# Patient Record
Sex: Male | Born: 1985 | Race: White | Hispanic: No | Marital: Single | Smoking: Current every day smoker
Health system: Southern US, Community
[De-identification: ages and names within clinical notes are randomized; demographics above are authoritative.]

## PROBLEM LIST (undated history)

## (undated) DIAGNOSIS — F1111 Opioid abuse, in remission: Secondary | ICD-10-CM

## (undated) HISTORY — PX: APPENDECTOMY: SHX54

---
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
## 2007-11-15 IMAGING — CR DG FOOT COMPLETE 3+V*R*
3 series · 3 of 3 positions shown · non-contrast
Comparison: none

CLINICAL DATA: Right foot crushing injury.  Heel pain.  
 RIGHT FOOT - 3 VIEW:
 There is no evidence of fracture or dislocation.  There is no evidence of arthropathy or other focal bone abnormality.  Soft tissues are unremarkable.

[t foot ap right]
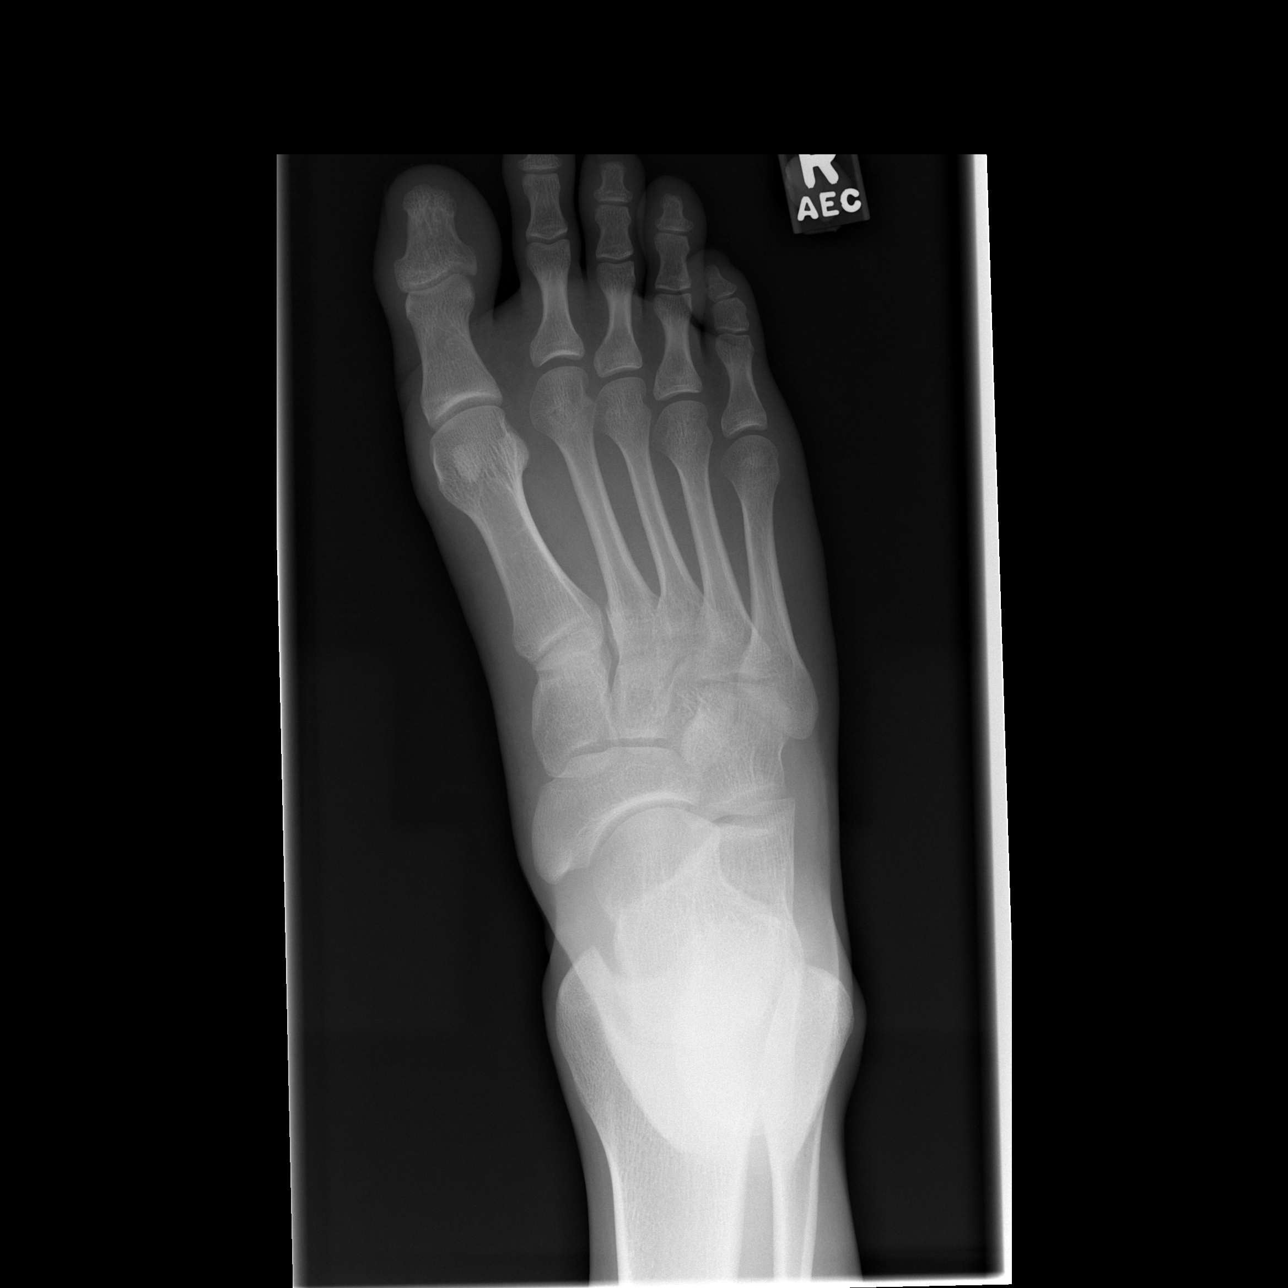

[t foot oblique right]
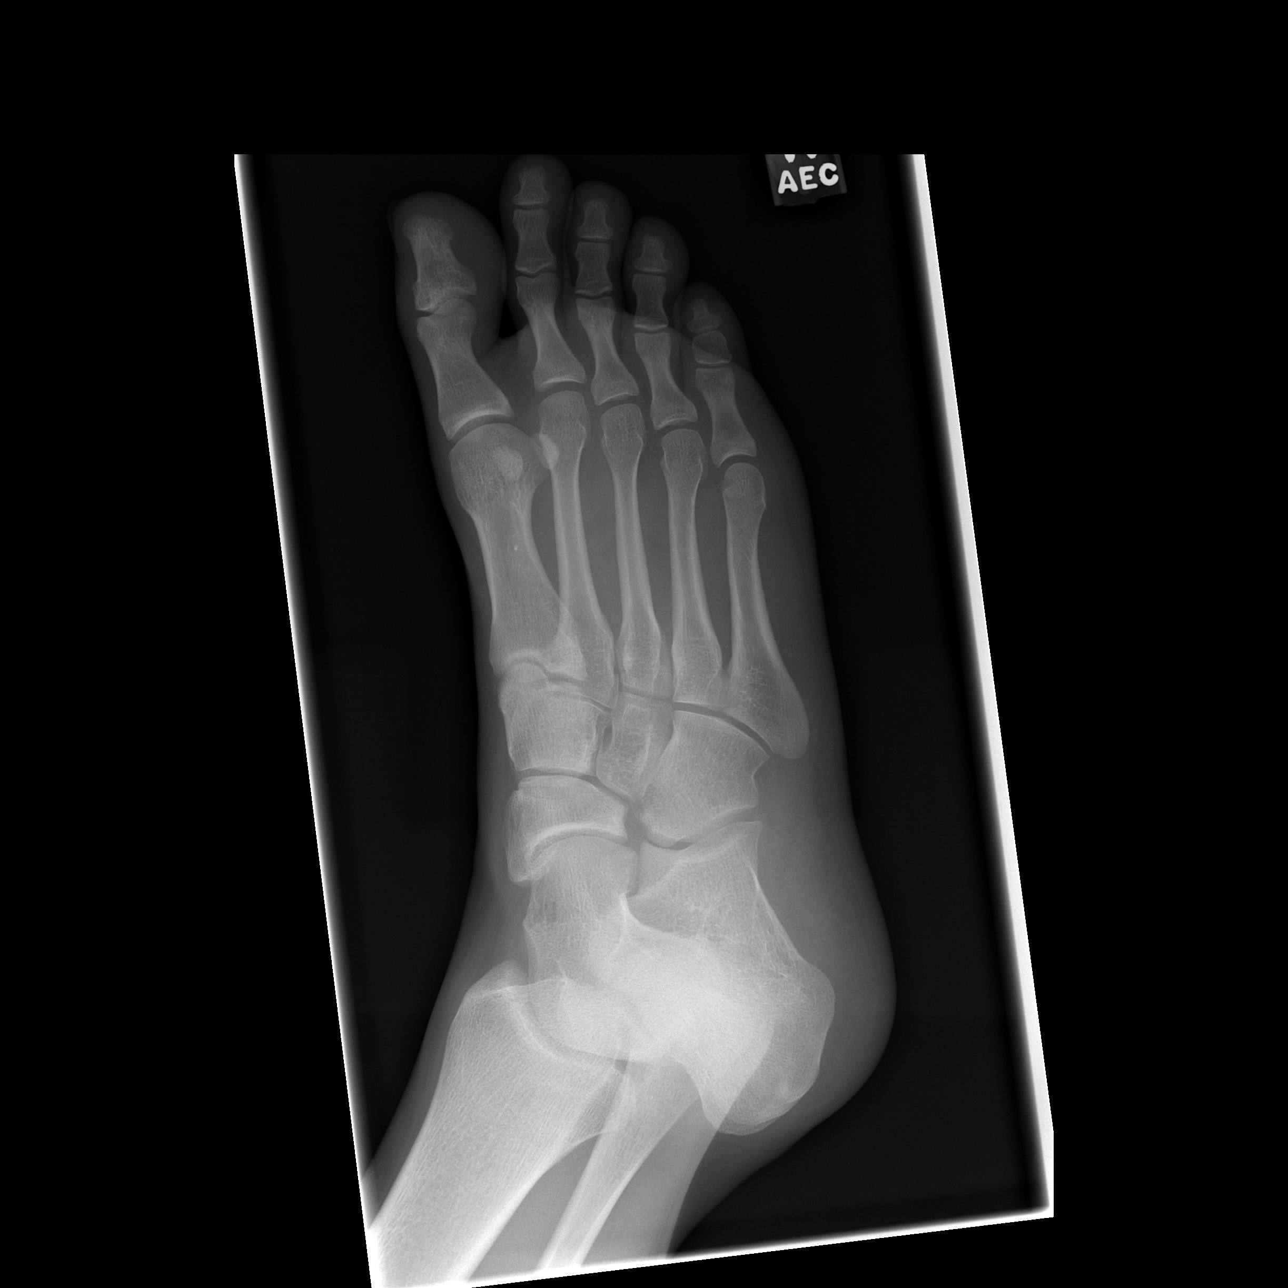

[t foot lat right]
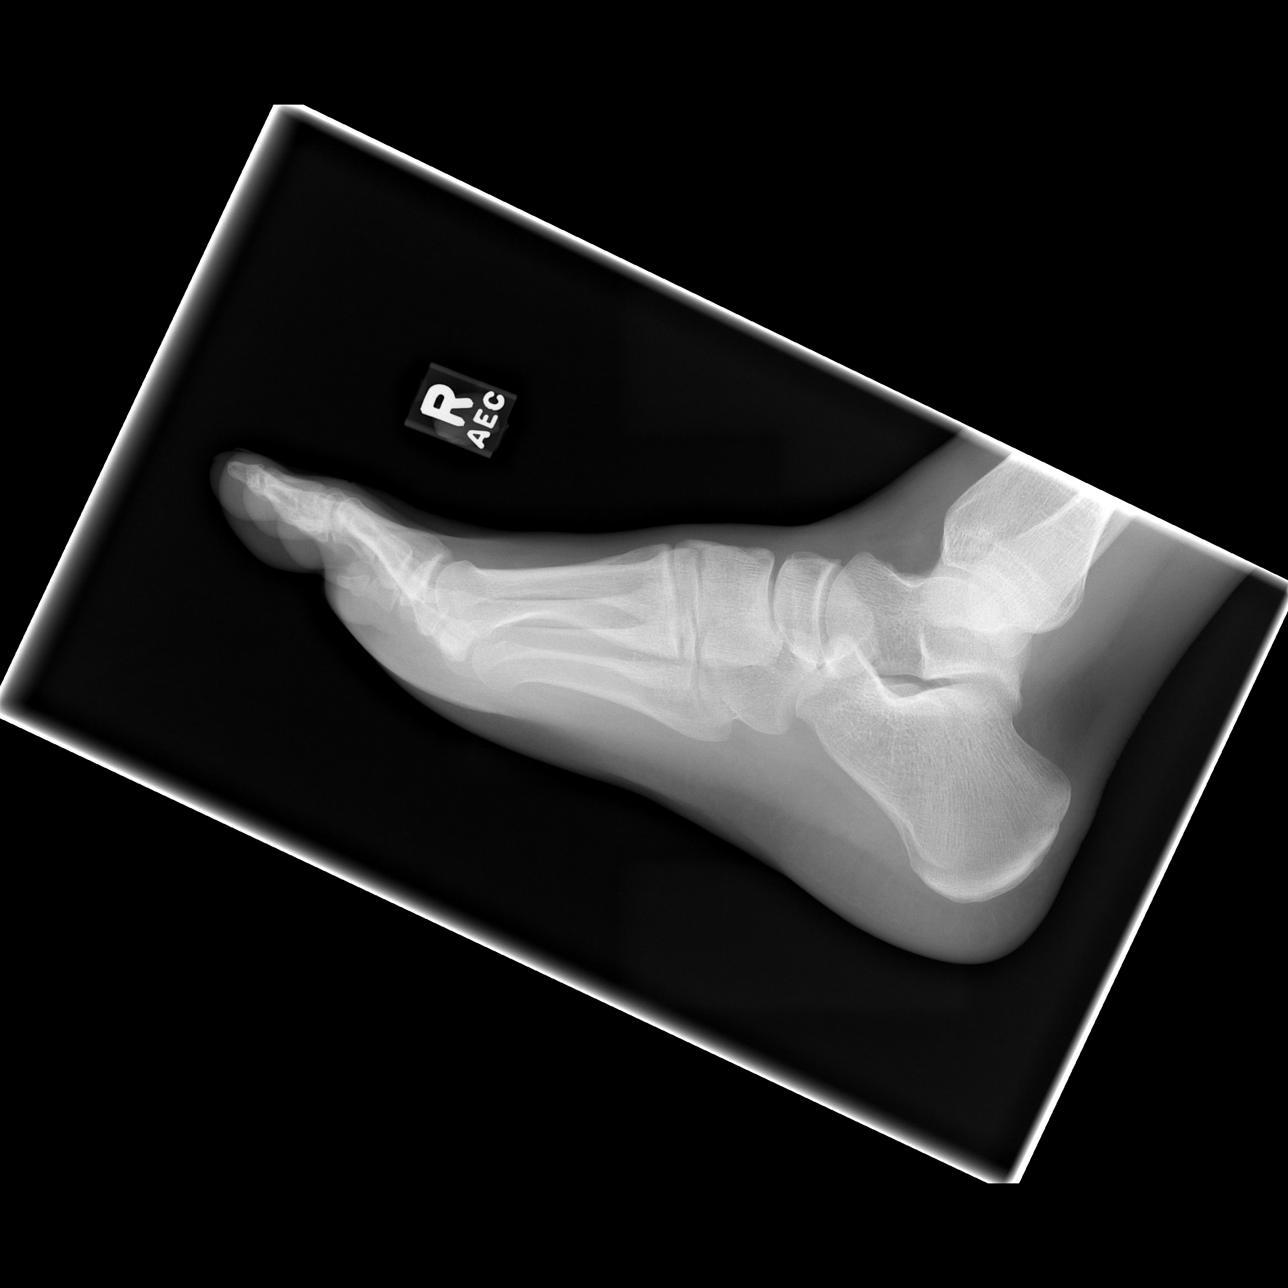

[3 of 3 positions shown; findings below may reference images not displayed]

IMPRESSION: Negative.

## 2017-06-16 DIAGNOSIS — F411 Generalized anxiety disorder: Secondary | ICD-10-CM | POA: Insufficient documentation

## 2017-06-16 DIAGNOSIS — F1120 Opioid dependence, uncomplicated: Secondary | ICD-10-CM | POA: Insufficient documentation

## 2017-06-16 DIAGNOSIS — Z8619 Personal history of other infectious and parasitic diseases: Secondary | ICD-10-CM | POA: Insufficient documentation

## 2020-07-22 ENCOUNTER — Emergency Department (INDEPENDENT_AMBULATORY_CARE_PROVIDER_SITE_OTHER)
Admission: EM | Admit: 2020-07-22 | Discharge: 2020-07-22 | Disposition: A | Discharge status: Home / Self Care | Payer: Self-pay | Source: Home / Self Care | Attending: Family Medicine | Admitting: Family Medicine

## 2020-07-22 ENCOUNTER — Telehealth: Payer: Self-pay | Admitting: Emergency Medicine

## 2020-07-22 ENCOUNTER — Encounter: Payer: Self-pay | Admitting: Emergency Medicine

## 2020-07-22 ENCOUNTER — Other Ambulatory Visit: Payer: Self-pay

## 2020-07-22 DIAGNOSIS — R112 Nausea with vomiting, unspecified: Secondary | ICD-10-CM

## 2020-07-22 DIAGNOSIS — K2900 Acute gastritis without bleeding: Secondary | ICD-10-CM

## 2020-07-22 HISTORY — DX: Opioid abuse, in remission: F11.11

## 2020-07-22 MED ORDER — ONDANSETRON 4 MG PO TBDP
4.0000 mg | ORAL_TABLET | ORAL | Status: AC
  Administered 2020-07-22: 4 mg via ORAL

## 2020-07-22 MED ORDER — OMEPRAZOLE 40 MG PO CPDR: 40.0000 mg | DELAYED_RELEASE_CAPSULE | Freq: Every day | ORAL | 0 refills | Status: DC

## 2020-07-22 MED ORDER — ONDANSETRON HCL 4 MG PO TABS: 4.0000 mg | ORAL_TABLET | Freq: Four times a day (QID) | ORAL | 0 refills | Status: DC

## 2020-07-22 MED ORDER — OMEPRAZOLE 40 MG PO CPDR: 40.0000 mg | DELAYED_RELEASE_CAPSULE | Freq: Every day | ORAL | 0 refills | Status: AC

## 2020-07-22 MED ORDER — ONDANSETRON HCL 4 MG PO TABS: 4.0000 mg | ORAL_TABLET | Freq: Four times a day (QID) | ORAL | 0 refills | Status: AC

## 2020-07-22 NOTE — ED Notes (Signed)
Pharmacy changed to [facility] in [place] due to cost of meds

## 2020-07-22 NOTE — Telephone Encounter (Signed)
See ED note - pharmacy changed

## 2020-07-22 NOTE — ED Triage Notes (Addendum)
Woke up w/umbilical pain (appy at age 35 per pt) N/V - last emesis 90 min pta Zofran given in triage  NO COVID or FLU vaccine Denies fever or chills  BM this am  Hx of opiate abuse in past - most recent was fentanyl - last usage was 10 days ago

## 2020-07-22 NOTE — Discharge Instructions (Addendum)
I have printed your prescriptions They are much cheaper at [facility] Clear liquids at first Advance to bland diet as you see improvement Go to ER if worse, if  you need IV fluids, If you fail to improve

## 2020-07-23 NOTE — ED Provider Notes (Signed)
[facility]    CSN: [id number] Arrival date & time: 07/22/20  1458      History   Chief Complaint Chief Complaint  Patient presents with  . Abdominal Pain  . Emesis    HPI Keith Snyder is a 35 y.o. male.   HPI   Here for abdominal pain and vomiting. Patient is addicted to opioids and has recent use  Fentanyl, past use heroin.  Multiple rehab efforts.  Is going to [facility].  States is trying to stop on his own.  Contacted [facility] but was rejected due to positive drug screen that was obtained 5 days after alleged discontinuation.  Is having abdominal pain nausea and vomiting.  Cannot keep anything down. Feels anxious. Not vaccinated for COVID or FLU.  NO fever or chills, headache.  no cough or SOB.  No known exposure to illness Denies alcohol Denies stomach problems in past   Past Medical History:  Diagnosis Date  . Fentanyl use disorder, mild, in early remission, abuse [facility])     Patient Active Problem List   Diagnosis Date Noted  . GAD (generalized anxiety disorder) 06/16/2017  . Heroin use disorder, moderate, dependence (HCC) 06/16/2017  . History of hepatitis C 06/16/2017    Past Surgical History:  Procedure Laterality Date  . APPENDECTOMY         Home Medications    Prior to Admission medications   Medication Sig Start Date End Date Taking? Authorizing Provider  omeprazole (PRILOSEC) 40 MG capsule Take 1 capsule (40 mg total) by mouth daily for 30 doses. 07/22/20 08/21/20  Eustace Moore, MD  ondansetron (ZOFRAN) 4 MG tablet Take 1 tablet (4 mg total) by mouth every 6 (six) hours. 07/22/20   Eustace Moore, MD    Family History Family History  Problem Relation Age of Onset  . Healthy Mother   . Healthy Father   . Healthy Sister   . Healthy Brother   . Healthy Sister   . Healthy Sister   . Healthy Brother   . Healthy Brother     Social History Social History   Tobacco Use  . Smoking status: Current Every Day Smoker    Types:  Cigarettes, E-cigarettes  . Smokeless tobacco: Never Used  Vaping Use  . Vaping Use: Every day  . Substances: Nicotine  Substance Use Topics  . Alcohol use: Not Currently  . Drug use: Not Currently    Comment: Fentanyl usage - none in 10 days     Allergies   Penicillins   Review of Systems Review of Systems See HPI  Physical Exam Triage Vital Signs ED Triage Vitals  Enc Vitals Group     BP 07/22/20 1530 (!) 148/92     Pulse Rate 07/22/20 1530 72     Resp 07/22/20 1530 15     Temp 07/22/20 1530 98.3 F (36.8 C)     Temp Source 07/22/20 1530 Oral     SpO2 07/22/20 1530 98 %     Weight 07/22/20 1532 150 lb (68 kg)     Height 07/22/20 1532 6' (1.829 m)     Head Circumference --      Peak Flow --      Pain Score 07/22/20 1531 9     Pain Loc --      Pain Edu? --      Excl. in GC? --    No data found.  Updated Vital Signs BP (!) 148/92 (BP Location: Right Arm)  Pulse 72   Temp 98.3 F (36.8 C) (Oral)   Resp 15   Ht 6' (1.829 m)   Wt 68 kg   SpO2 98%   BMI 20.34 kg/m      Physical Exam Constitutional:      General: He is not in acute distress.    Appearance: He is well-developed and well-nourished.     Comments: Pleasant.  Thin.  In obvious discomfort  HENT:     Head: Normocephalic and atraumatic.     Mouth/Throat:     Mouth: Oropharynx is clear and moist. Mucous membranes are moist.  Eyes:     Conjunctiva/sclera: Conjunctivae normal.     Pupils: Pupils are equal, round, and reactive to light.  Cardiovascular:     Rate and Rhythm: Normal rate.  Pulmonary:     Effort: Pulmonary effort is normal. No respiratory distress.  Abdominal:     General: Abdomen is flat. Bowel sounds are normal. There is no distension.     Palpations: Abdomen is soft.     Comments: Acutely tender epigastrium   Musculoskeletal:        General: No edema. Normal range of motion.     Cervical back: Normal range of motion.  Skin:    General: Skin is warm and dry.   Neurological:     General: No focal deficit present.     Mental Status: He is alert.      UC Treatments / Results  Labs (all labs ordered are listed, but only abnormal results are displayed) Labs Reviewed - No data to display  EKG   Radiology No results found.  Procedures Procedures (including critical care time)  Medications Ordered in UC Medications  ondansetron (ZOFRAN-ODT) disintegrating tablet 4 mg (4 mg Oral Given 07/22/20 1535)    Initial Impression / Assessment and Plan / UC Course  I have reviewed the triage vital signs and the nursing notes.  Pertinent labs & imaging results that were available during my care of the patient were reviewed by me and considered in my medical decision making (see chart for details).     Cannot diagnose opioid withdrawal since there is question regarding his last use.  He states he has not used in 10 days, but failed a drug screen 5 d ago allegedly for multiple drugs.  Clearly has epigastric distress and vomiting.  Will treat with PPI and zofran. Final Clinical Impressions(s) / UC Diagnoses   Final diagnoses:  Acute gastritis without hemorrhage, unspecified gastritis type  Intractable vomiting with nausea, unspecified vomiting type     Discharge Instructions     I have printed your prescriptions They are much cheaper at [facility] Clear liquids at first Advance to bland diet as you see improvement Go to ER if worse, if  you need IV fluids, If you fail to improve   ED Prescriptions    Medication Sig Dispense Auth. Provider   ondansetron (ZOFRAN) 4 MG tablet Take 1 tablet (4 mg total) by mouth every 6 (six) hours. 20 tablet Eustace Moore, MD   omeprazole (PRILOSEC) 40 MG capsule Take 1 capsule (40 mg total) by mouth daily. 30 capsule Eustace Moore, MD     PDMP not reviewed this encounter.   Eustace Moore, MD 07/23/20 [phone]

## 2020-11-13 DEATH — deceased
